# Patient Record
Sex: Female | Born: 1965 | Race: Black or African American | Hispanic: No | Marital: Single | Smoking: Never smoker
Health system: Southern US, Community
[De-identification: ages and names within clinical notes are randomized; demographics above are authoritative.]

## PROBLEM LIST (undated history)

## (undated) DIAGNOSIS — I10 Essential (primary) hypertension: Secondary | ICD-10-CM

## (undated) DIAGNOSIS — D219 Benign neoplasm of connective and other soft tissue, unspecified: Secondary | ICD-10-CM

## (undated) HISTORY — PX: CHOLECYSTECTOMY: SHX55

---

## 2014-09-15 ENCOUNTER — Emergency Department (HOSPITAL_BASED_OUTPATIENT_CLINIC_OR_DEPARTMENT_OTHER)
Admission: EM | Admit: 2014-09-15 | Discharge: 2014-09-15 | Disposition: A | Discharge status: Home / Self Care | Payer: Medicaid Other | Attending: Emergency Medicine | Admitting: Emergency Medicine

## 2014-09-15 ENCOUNTER — Encounter (HOSPITAL_BASED_OUTPATIENT_CLINIC_OR_DEPARTMENT_OTHER): Payer: Self-pay | Admitting: *Deleted

## 2014-09-15 DIAGNOSIS — Z79899 Other long term (current) drug therapy: Secondary | ICD-10-CM | POA: Insufficient documentation

## 2014-09-15 DIAGNOSIS — R002 Palpitations: Secondary | ICD-10-CM | POA: Insufficient documentation

## 2014-09-15 DIAGNOSIS — R0981 Nasal congestion: Secondary | ICD-10-CM | POA: Diagnosis present

## 2014-09-15 DIAGNOSIS — J3489 Other specified disorders of nose and nasal sinuses: Secondary | ICD-10-CM | POA: Diagnosis not present

## 2014-09-15 DIAGNOSIS — I10 Essential (primary) hypertension: Secondary | ICD-10-CM | POA: Insufficient documentation

## 2014-09-15 HISTORY — DX: Essential (primary) hypertension: I10

## 2014-09-15 MED ORDER — FLUTICASONE PROPIONATE 50 MCG/ACT NA SUSP: 2.0000 | Freq: Every day | NASAL | Status: AC

## 2014-09-15 MED ORDER — OXYMETAZOLINE HCL 0.05 % NA SOLN: 1.0000 | Freq: Two times a day (BID) | NASAL | Status: AC

## 2014-09-15 NOTE — ED Notes (Signed)
Cough, congestion, sinus pain, sneezing- states she feels SOB when she lies down to try to sleep

## 2014-09-15 NOTE — ED Provider Notes (Signed)
CSN: [id number]     Arrival date & time 09/15/14  1750 History  This chart was scribed for Wandra Arthurs, MD by Jeanell Sparrow, ED Scribe. This patient was seen in room MH09/MH09 and the patient's care was started at 8:17 PM.  Chief Complaint  Patient presents with  . URI   The history is provided by the patient. No language interpreter was used.    HPI Comments: Rebecca Decker is a 49 y.o. female who presents to the Emergency Department complaining of a URI that started 4 days ago. She states that she has been having congestion, rhinorrhea, a nonproductive cough since she came [place] from [place]. She reports no treatment PTA except drinking hot tea. She states that she gasps for air and has palpitations at night. She reports a hx of HTN, which she is currently on medication for. She denies any fever. No hx of sleep apnea and not smoker.   Past Medical History  Diagnosis Date  . Hypertension    Past Surgical History  Procedure Laterality Date  . Cholecystectomy     No family history on file. History  Substance Use Topics  . Smoking status: Never Smoker   . Smokeless tobacco: Never Used  . Alcohol Use: Yes   OB History    No data available     Review of Systems  Constitutional: Negative for fever.  HENT: Positive for congestion and rhinorrhea.   Respiratory: Positive for cough and shortness of breath.   Cardiovascular: Positive for palpitations.  All other systems reviewed and are negative.   Allergies  Review of patient's allergies indicates no known allergies.  Home Medications   Prior to Admission medications   Medication Sig Start Date End Date Taking? Authorizing Provider  hydrochlorothiazide (HYDRODIURIL) 25 MG tablet Take 25 mg by mouth daily.   Yes Historical Provider, MD  metoprolol (LOPRESSOR) 100 MG tablet Take 100 mg by mouth 2 (two) times daily.   Yes Historical Provider, MD   BP 161/90 mmHg  Pulse 57  Temp(Src) 98.5 F (36.9 C) (Oral)  Resp 18  Ht  5\' 9"  (1.753 m)  Wt 250 lb (113.399 kg)  BMI 36.90 kg/m2  SpO2 100% Physical Exam  Constitutional: She is oriented to person, place, and time. She appears well-developed and well-nourished. No distress.  HENT:  Head: Normocephalic and atraumatic.  Mouth/Throat: Oropharynx is clear and moist. No oropharyngeal exudate.  No sinus tenderness   Neck: Neck supple. No tracheal deviation present.  Cardiovascular: Normal rate, regular rhythm and normal heart sounds.  Exam reveals no gallop and no friction rub.   No murmur heard. Pulmonary/Chest: Effort normal and breath sounds normal. No respiratory distress. She has no wheezes. She has no rales.  No wheezing   Abdominal: Soft. There is no tenderness.  Musculoskeletal: Normal range of motion.  Lymphadenopathy:    She has no cervical adenopathy.  Neurological: She is alert and oriented to person, place, and time.  Skin: Skin is warm and dry.  Psychiatric: She has a normal mood and affect. Her behavior is normal.  Nursing note and vitals reviewed.   ED Course  Procedures (including critical care time) DIAGNOSTIC STUDIES: Oxygen Saturation is 100% on RA, normal by my interpretation.    COORDINATION OF CARE: 8:21 PM- Pt advised of plan for treatment and pt agrees.  Labs Review Labs Reviewed - No data to display  Imaging Review No results found.   EKG Interpretation None  MDM   Final diagnoses:  None   Rebecca Decker is a 49 y.o. female here with sinus congestion, SOB at night. Vitals stable, slightly hypertensive only. Not tachy. I doubt PE. No sinus tenderness or purulent discharge to suggest sinusitis. Symptoms likely from sinus congestion. Will dc home with flonase, afrin.    I personally performed the services described in this documentation, which was scribed in my presence. The recorded information has been reviewed and is accurate.     Wandra Arthurs, MD 09/15/14 2037

## 2014-09-15 NOTE — Discharge Instructions (Signed)
Try flonase twice daily for a week.   Try afrin twice daily for at most 3 days.   Follow up with your doctor.   Return to ER if you have worse congestion, trouble breathing.

## 2015-03-21 ENCOUNTER — Telehealth: Payer: Self-pay | Admitting: Family Medicine

## 2015-03-21 NOTE — Telephone Encounter (Signed)
Pt requesting lab results to be mailed to her home.

## 2015-03-21 NOTE — Telephone Encounter (Signed)
Unable to find any labs on this patient, I tried to call this patient but phone number is not in working order.

## 2015-10-16 ENCOUNTER — Emergency Department (HOSPITAL_COMMUNITY)
Admission: EM | Admit: 2015-10-16 | Discharge: 2015-10-17 | Disposition: A | Discharge status: Home / Self Care | Payer: Medicaid Other | Attending: Emergency Medicine | Admitting: Emergency Medicine

## 2015-10-16 ENCOUNTER — Encounter (HOSPITAL_COMMUNITY): Payer: Self-pay | Admitting: Emergency Medicine

## 2015-10-16 DIAGNOSIS — Z3202 Encounter for pregnancy test, result negative: Secondary | ICD-10-CM | POA: Insufficient documentation

## 2015-10-16 DIAGNOSIS — N390 Urinary tract infection, site not specified: Secondary | ICD-10-CM | POA: Insufficient documentation

## 2015-10-16 DIAGNOSIS — Z86018 Personal history of other benign neoplasm: Secondary | ICD-10-CM | POA: Diagnosis not present

## 2015-10-16 DIAGNOSIS — I10 Essential (primary) hypertension: Secondary | ICD-10-CM | POA: Insufficient documentation

## 2015-10-16 DIAGNOSIS — Z7951 Long term (current) use of inhaled steroids: Secondary | ICD-10-CM | POA: Insufficient documentation

## 2015-10-16 DIAGNOSIS — R300 Dysuria: Secondary | ICD-10-CM | POA: Diagnosis present

## 2015-10-16 DIAGNOSIS — Z79899 Other long term (current) drug therapy: Secondary | ICD-10-CM | POA: Insufficient documentation

## 2015-10-16 DIAGNOSIS — R109 Unspecified abdominal pain: Secondary | ICD-10-CM

## 2015-10-16 HISTORY — DX: Benign neoplasm of connective and other soft tissue, unspecified: D21.9

## 2015-10-16 LAB — URINALYSIS, ROUTINE W REFLEX MICROSCOPIC
Bilirubin Urine: NEGATIVE
Glucose, UA: NEGATIVE mg/dL
Ketones, ur: NEGATIVE mg/dL
Nitrite: NEGATIVE
PH: 7 (ref 5.0–8.0)
PROTEIN: 100 mg/dL — AB
SPECIFIC GRAVITY, URINE: 1.006 (ref 1.005–1.030)

## 2015-10-16 LAB — COMPREHENSIVE METABOLIC PANEL
ALBUMIN: 3.7 g/dL (ref 3.5–5.0)
ALK PHOS: 52 U/L (ref 38–126)
ALT: 18 U/L (ref 14–54)
AST: 22 U/L (ref 15–41)
Anion gap: 9 (ref 5–15)
BUN: 13 mg/dL (ref 6–20)
CO2: 26 mmol/L (ref 22–32)
CREATININE: 1.16 mg/dL — AB (ref 0.44–1.00)
Calcium: 9.1 mg/dL (ref 8.9–10.3)
Chloride: 104 mmol/L (ref 101–111)
GFR calc Af Amer: >60 mL/min (ref >60)
GFR calc non Af Amer: 54 mL/min — ABNORMAL LOW (ref >60)
GLUCOSE: 106 mg/dL — AB (ref 65–99)
Potassium: 3.9 mmol/L (ref 3.5–5.1)
SODIUM: 139 mmol/L (ref 135–145)
Total Bilirubin: 0.4 mg/dL (ref 0.3–1.2)
Total Protein: 7.1 g/dL (ref 6.5–8.1)

## 2015-10-16 LAB — CBC
HCT: 37.6 % (ref 36.0–46.0)
HEMOGLOBIN: 12.4 g/dL (ref 12.0–15.0)
MCH: 29.2 pg (ref 26.0–34.0)
MCHC: 33 g/dL (ref 30.0–36.0)
MCV: 88.5 fL (ref 78.0–100.0)
Platelets: 340 10*3/uL (ref 150–400)
RBC: 4.25 MIL/uL (ref 3.87–5.11)
RDW: 13.6 % (ref 11.5–15.5)
WBC: 10.5 10*3/uL (ref 4.0–10.5)

## 2015-10-16 LAB — URINE MICROSCOPIC-ADD ON

## 2015-10-16 LAB — POC URINE PREG, ED: Preg Test, Ur: NEGATIVE

## 2015-10-16 LAB — LIPASE, BLOOD: Lipase: 18 U/L (ref 11–51)

## 2015-10-16 NOTE — ED Notes (Signed)
EDP ( Dr. Tyron Russell) , Charge nurse Gretta Cool ) and Nurse first Bayard Hugger) notified on pt."s heart rate = 52 .

## 2015-10-16 NOTE — ED Notes (Signed)
Pt. reports dysuria , urinary frequency , low abdominal pain and right low back pain onset today . No fever or chills , denies emesis or diarrhea .

## 2015-10-17 ENCOUNTER — Emergency Department (HOSPITAL_COMMUNITY): Payer: Medicaid Other

## 2015-10-17 MED ORDER — OXYCODONE-ACETAMINOPHEN 5-325 MG PO TABS: 1.0000 | ORAL_TABLET | Freq: Four times a day (QID) | ORAL | Status: AC | PRN

## 2015-10-17 MED ORDER — KETOROLAC TROMETHAMINE 30 MG/ML IJ SOLN
30.0000 mg | Freq: Once | INTRAMUSCULAR | Status: AC
  Administered 2015-10-17: 30 mg via INTRAVENOUS
  Filled 2015-10-17: qty 1

## 2015-10-17 MED ORDER — ONDANSETRON HCL 4 MG/2ML IJ SOLN
4.0000 mg | Freq: Once | INTRAMUSCULAR | Status: AC
  Administered 2015-10-17: 4 mg via INTRAVENOUS
  Filled 2015-10-17: qty 2

## 2015-10-17 MED ORDER — CEPHALEXIN 250 MG PO CAPS
500.0000 mg | ORAL_CAPSULE | Freq: Once | ORAL | Status: AC
  Administered 2015-10-17: 500 mg via ORAL
  Filled 2015-10-17: qty 2

## 2015-10-17 MED ORDER — HYDROMORPHONE HCL 1 MG/ML IJ SOLN
1.0000 mg | Freq: Once | INTRAMUSCULAR | Status: AC
Start: 2015-10-17 — End: 2015-10-17
  Administered 2015-10-17: 1 mg via INTRAVENOUS
  Filled 2015-10-17: qty 1

## 2015-10-17 MED ORDER — CEPHALEXIN 500 MG PO CAPS: 500.0000 mg | ORAL_CAPSULE | Freq: Two times a day (BID) | ORAL | Status: AC

## 2015-10-17 MED ORDER — HYDROMORPHONE HCL 1 MG/ML IJ SOLN
1.0000 mg | Freq: Once | INTRAMUSCULAR | Status: AC
  Administered 2015-10-17: 1 mg via INTRAVENOUS
  Filled 2015-10-17: qty 1

## 2015-10-17 NOTE — ED Provider Notes (Signed)
CSN: [id number]     Arrival date & time 10/16/15  2200 History   First MD Initiated Contact with Patient 10/17/15 0003     Chief Complaint  Patient presents with  . Dysuria  . Abdominal Pain     (Consider location/radiation/quality/duration/timing/severity/associated sxs/prior Treatment) HPI Patient presents to the emergency department with flank pain, urinary frequency and lower abdominal pain that started earlier this afternoon.  The patient states that she did notice some difficulty with urination several days ago.  She states that she had also had frequency of urination.The patient denies chest pain, shortness of breath, headache,blurred vision, neck pain, fever, cough, weakness, numbness, dizziness, anorexia, edema, abdominal pain, nausea, vomiting, diarrhea, rash, hematemesis, bloody stool, near syncope, or syncope. Past Medical History  Diagnosis Date  . Hypertension   . Fibroids    Past Surgical History  Procedure Laterality Date  . Cholecystectomy     No family history on file. Social History  Substance Use Topics  . Smoking status: Never Smoker   . Smokeless tobacco: Never Used  . Alcohol Use: Yes   OB History    No data available     Review of Systems  All other systems negative except as documented in the HPI. All pertinent positives and negatives as reviewed in the HPI.  Allergies  Review of patient's allergies indicates no known allergies.  Home Medications   Prior to Admission medications   Medication Sig Start Date End Date Taking? Authorizing Provider  fluticasone (FLONASE) 50 MCG/ACT nasal spray Place 2 sprays into both nostrils daily. 09/15/14   Wandra Arthurs, MD  hydrochlorothiazide (HYDRODIURIL) 25 MG tablet Take 25 mg by mouth daily.    Historical Provider, MD  metoprolol (LOPRESSOR) 100 MG tablet Take 100 mg by mouth 2 (two) times daily.    Historical Provider, MD  oxymetazoline (AFRIN NASAL SPRAY) 0.05 % nasal spray Place 1 spray into both nostrils  2 (two) times daily. 09/15/14   Wandra Arthurs, MD   BP 160/83 mmHg  Pulse 52  Temp(Src) 98.5 F (36.9 C) (Oral)  Resp 24  Ht 5\' 9"  (1.753 m)  Wt 115.667 kg  BMI 37.64 kg/m2  SpO2 99%  LMP 10/02/2015 (Approximate) Physical Exam  Constitutional: She is oriented to person, place, and time. She appears well-developed and well-nourished. No distress.  HENT:  Head: Normocephalic and atraumatic.  Mouth/Throat: Oropharynx is clear and moist.  Eyes: Pupils are equal, round, and reactive to light.  Neck: Normal range of motion. Neck supple.  Cardiovascular: Normal rate, regular rhythm and normal heart sounds.  Exam reveals no gallop and no friction rub.   No murmur heard. Pulmonary/Chest: Effort normal and breath sounds normal. No respiratory distress. She has no wheezes.  Abdominal: Soft. Bowel sounds are normal. She exhibits no distension. There is tenderness. There is no rebound and no guarding.  Neurological: She is alert and oriented to person, place, and time. She exhibits normal muscle tone. Coordination normal.  Skin: Skin is warm and dry. No rash noted. No erythema.  Psychiatric: She has a normal mood and affect. Her behavior is normal.  Nursing note and vitals reviewed.   ED Course  Procedures (including critical care time) Labs Review Labs Reviewed  COMPREHENSIVE METABOLIC PANEL - Abnormal; Notable for the following:    Glucose, Bld 106 (*)    Creatinine, Ser 1.16 (*)    GFR calc non Af Amer 54 (*)    All other components within normal limits  URINALYSIS,  ROUTINE W REFLEX MICROSCOPIC (NOT AT [facility]) - Abnormal; Notable for the following:    APPearance CLOUDY (*)    Hgb urine dipstick LARGE (*)    Protein, ur 100 (*)    Leukocytes, UA LARGE (*)    All other components within normal limits  URINE MICROSCOPIC-ADD ON - Abnormal; Notable for the following:    Squamous Epithelial / LPF 0-5 (*)    Bacteria, UA RARE (*)    All other components within normal limits  LIPASE, BLOOD   CBC  POC URINE PREG, ED    Imaging Review No results found. I have personally reviewed and evaluated these images and lab results as part of my medical decision-making.   EKG Interpretation   Date/Time:  Wednesday October 16 2015 22:41:40 EDT Ventricular Rate:  53 PR Interval:  188 QRS Duration: 88 QT Interval:  470 QTC Calculation: 441 R Axis:   -16 Text Interpretation:  Sinus bradycardia Low voltage QRS Borderline ECG  Confirmed by Hazle Coca [phone]) on 10/17/2015 12:25:12 AM      MDM   Final diagnoses:  Flank pain     Patient will be evaluated with CT scan for possible ureteral stone.  Patient is advised plan.  All questions were answered.  She is given pain control and is feeling some better at this time  Dalia Heading, PA-C 10/17/15 [place], MD 10/18/15 [phone]

## 2015-10-17 NOTE — Discharge Instructions (Signed)
Return here as needed. Follow up with the doctor provided.  Urinary Tract Infection Urinary tract infections (UTIs) can develop anywhere along your urinary tract. Your urinary tract is your body's drainage system for removing wastes and extra water. Your urinary tract includes two kidneys, two ureters, a bladder, and a urethra. Your kidneys are a pair of bean-shaped organs. Each kidney is about the size of your fist. They are located below your ribs, one on each side of your spine. CAUSES Infections are caused by microbes, which are microscopic organisms, including fungi, viruses, and bacteria. These organisms are so small that they can only be seen through a microscope. Bacteria are the microbes that most commonly cause UTIs. SYMPTOMS  Symptoms of UTIs may vary by age and gender of the patient and by the location of the infection. Symptoms in young women typically include a frequent and intense urge to urinate and a painful, burning feeling in the bladder or urethra during urination. Older women and men are more likely to be tired, shaky, and weak and have muscle aches and abdominal pain. A fever may mean the infection is in your kidneys. Other symptoms of a kidney infection include pain in your back or sides below the ribs, nausea, and vomiting. DIAGNOSIS To diagnose a UTI, your caregiver will ask you about your symptoms. Your caregiver will also ask you to provide a urine sample. The urine sample will be tested for bacteria and white blood cells. White blood cells are made by your body to help fight infection. TREATMENT  Typically, UTIs can be treated with medication. Because most UTIs are caused by a bacterial infection, they usually can be treated with the use of antibiotics. The choice of antibiotic and length of treatment depend on your symptoms and the type of bacteria causing your infection. HOME CARE INSTRUCTIONS  If you were prescribed antibiotics, take them exactly as your caregiver  instructs you. Finish the medication even if you feel better after you have only taken some of the medication.  Drink enough water and fluids to keep your urine clear or pale yellow.  Avoid caffeine, tea, and carbonated beverages. They tend to irritate your bladder.  Empty your bladder often. Avoid holding urine for long periods of time.  Empty your bladder before and after sexual intercourse.  After a bowel movement, women should cleanse from front to back. Use each tissue only once. SEEK MEDICAL CARE IF:   You have back pain.  You develop a fever.  Your symptoms do not begin to resolve within 3 days. SEEK IMMEDIATE MEDICAL CARE IF:   You have severe back pain or lower abdominal pain.  You develop chills.  You have nausea or vomiting.  You have continued burning or discomfort with urination. MAKE SURE YOU:   Understand these instructions.  Will watch your condition.  Will get help right away if you are not doing well or get worse.   This information is not intended to replace advice given to you by your health care provider. Make sure you discuss any questions you have with your health care provider.   Document Released: 04/15/2005 Document Revised: 03/27/2015 Document Reviewed: 08/14/2011 Elsevier Interactive Patient Education 2016 Elsevier Inc.  Abdominal Pain, Adult Many things can cause abdominal pain. Usually, abdominal pain is not caused by a disease and will improve without treatment. It can often be observed and treated at home. Your health care provider will do a physical exam and possibly order blood tests and X-rays to  help determine the seriousness of your pain. However, in many cases, more time must pass before a clear cause of the pain can be found. Before that point, your health care provider may not know if you need more testing or further treatment. HOME CARE INSTRUCTIONS Monitor your abdominal pain for any changes. The following actions may help to  alleviate any discomfort you are experiencing:  Only take over-the-counter or prescription medicines as directed by your health care provider.  Do not take laxatives unless directed to do so by your health care provider.  Try a clear liquid diet (broth, tea, or water) as directed by your health care provider. Slowly move to a bland diet as tolerated. SEEK MEDICAL CARE IF:  You have unexplained abdominal pain.  You have abdominal pain associated with nausea or diarrhea.  You have pain when you urinate or have a bowel movement.  You experience abdominal pain that wakes you in the night.  You have abdominal pain that is worsened or improved by eating food.  You have abdominal pain that is worsened with eating fatty foods.  You have a fever. SEEK IMMEDIATE MEDICAL CARE IF:  Your pain does not go away within 2 hours.  You keep throwing up (vomiting).  Your pain is felt only in portions of the abdomen, such as the right side or the left lower portion of the abdomen.  You pass bloody or black tarry stools. MAKE SURE YOU:  Understand these instructions.  Will watch your condition.  Will get help right away if you are not doing well or get worse.   This information is not intended to replace advice given to you by your health care provider. Make sure you discuss any questions you have with your health care provider.   Document Released: 04/15/2005 Document Revised: 03/27/2015 Document Reviewed: 03/15/2013 Elsevier Interactive Patient Education [employer].

## 2015-10-19 LAB — URINE CULTURE: Culture: >=100000

## 2015-10-20 ENCOUNTER — Telehealth (HOSPITAL_BASED_OUTPATIENT_CLINIC_OR_DEPARTMENT_OTHER): Payer: Self-pay | Admitting: Emergency Medicine

## 2015-10-20 NOTE — Telephone Encounter (Signed)
Post ED Visit - Positive Culture Follow-up  Culture report reviewed by antimicrobial stewardship pharmacist:  []  Elenor Quinones, Pharm.D. []  Heide Guile, Pharm.D., BCPS []  Parks Neptune, Pharm.D. []  Alycia Rossetti, Pharm.D., BCPS []  Edmund, [place].D., BCPS, AAHIVP []  Legrand Como, Pharm.D., BCPS, AAHIVP []  Milus Glazier, Pharm.D. []  Stephens November, Pharm.D. Salome Arnt PharmD  Positive urine  culture Treated with cephalexin, organism sensitive to the same and no further patient follow-up is required at this time.  Hazle Nordmann 10/20/2015, 12:22 PM

## 2017-05-05 IMAGING — CT CT RENAL STONE PROTOCOL
2 of 4 series · 16 of 46 positions shown, 18 images · non-contrast
Comparison: None.

CLINICAL DATA: 50-year-old female with history of and urinary
frequency.

EXAM:
CT ABDOMEN AND PELVIS WITHOUT CONTRAST
TECHNIQUE: Multidetector CT imaging of the abdomen and pelvis was performed
following the standard protocol without IV contrast.

[Series 2: stone study 5.0 i30f 1 · axial · 0.76mm/px · z∈[-492,-62]mm · 13 of 94 slices shown, 15 images]
[im 4/94  soft-tissue]
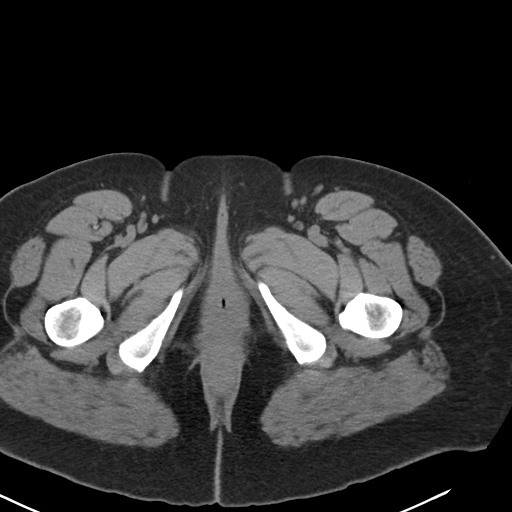
[im 4/94  bone]
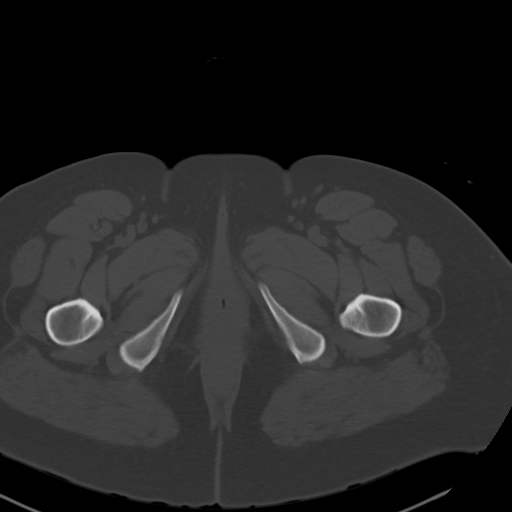
[im 12/94  soft-tissue]
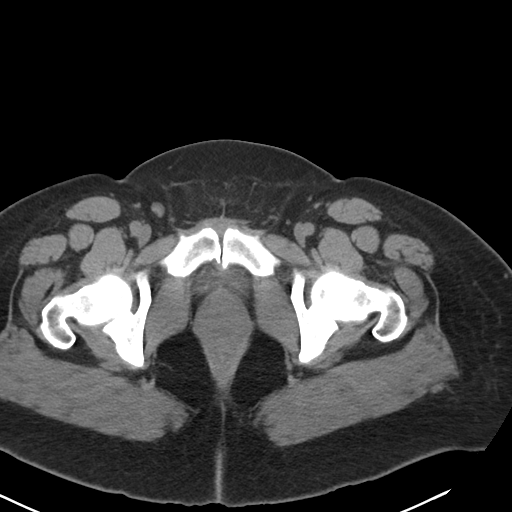
[im 20/94  soft-tissue]
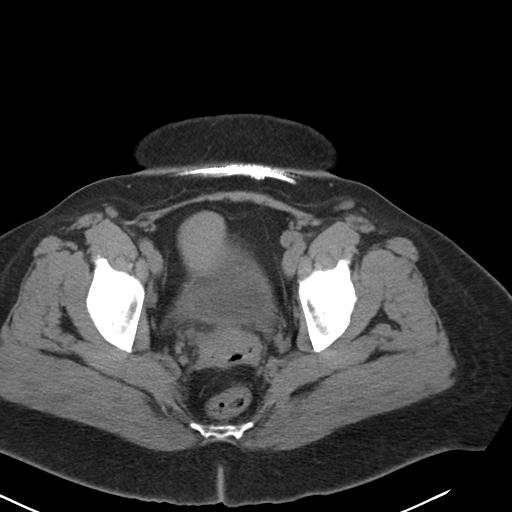
[im 28/94  soft-tissue]
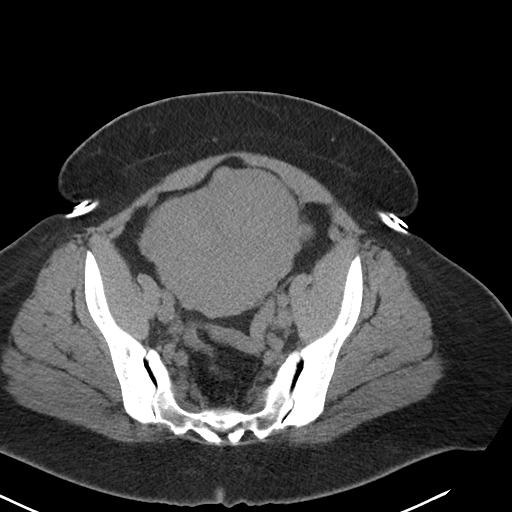
[im 32/94  soft-tissue]
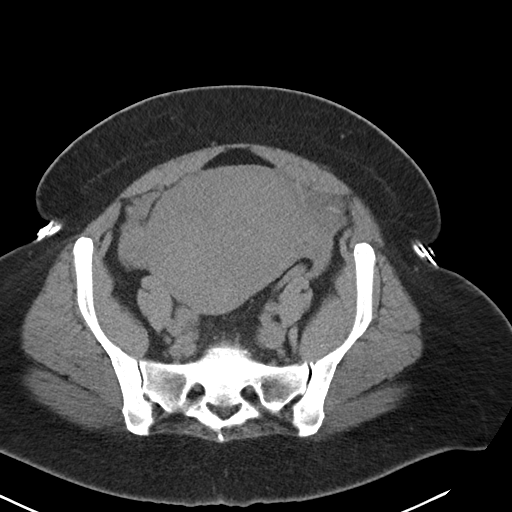
[im 39/94  soft-tissue]
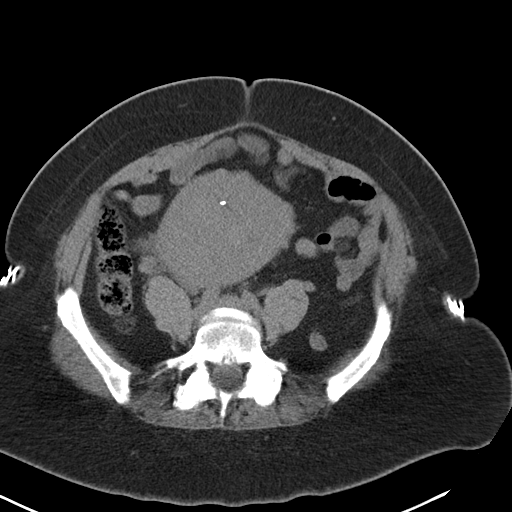
[im 47/94  soft-tissue]
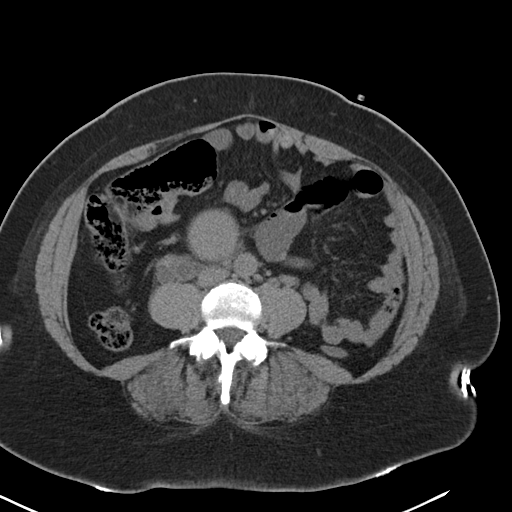
[im 55/94  soft-tissue]
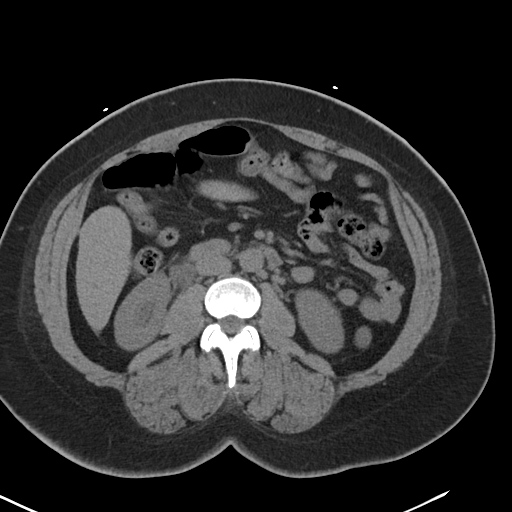
[im 63/94  soft-tissue]
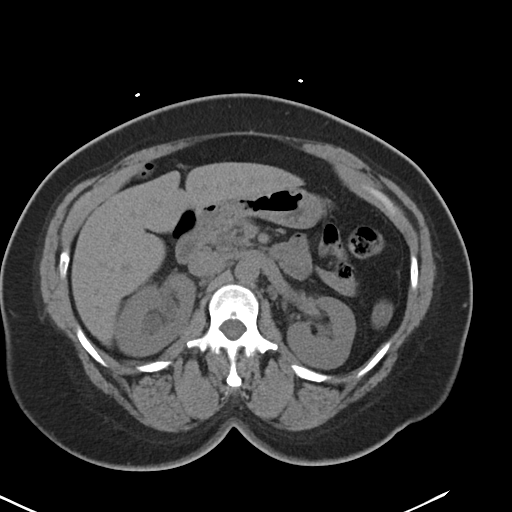
[im 63/94  bone]
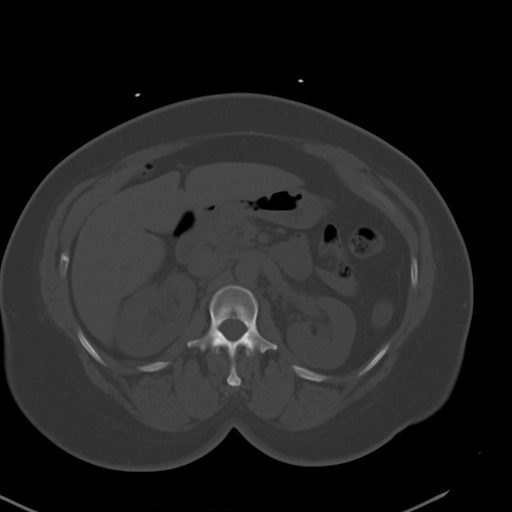
[im 66/94  soft-tissue]
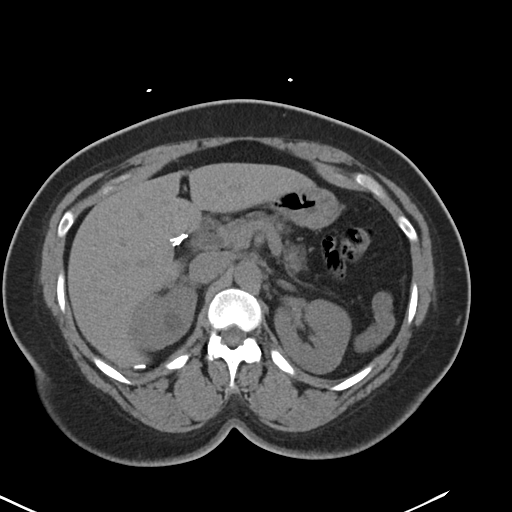
[im 74/94  soft-tissue]
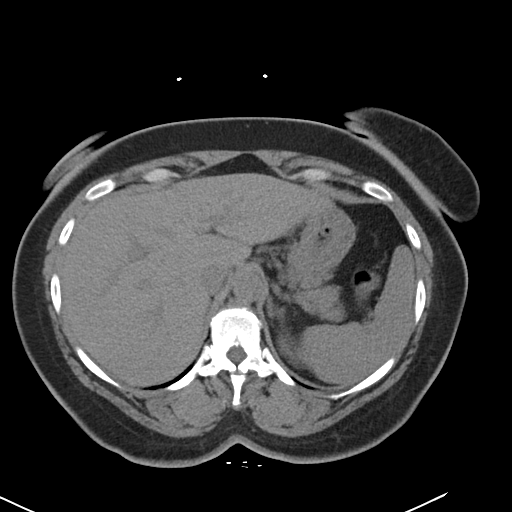
[im 82/94  soft-tissue]
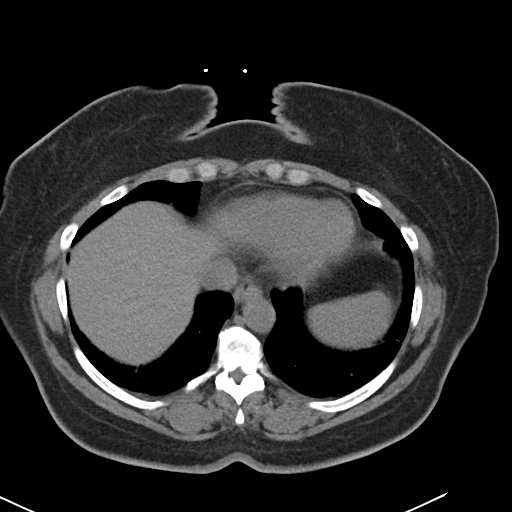
[im 90/94  soft-tissue]
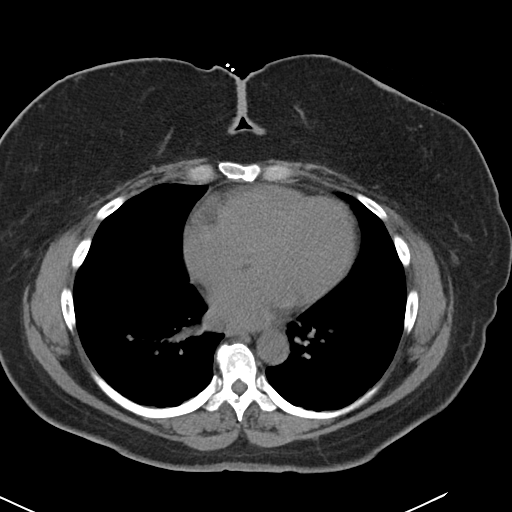

[Series 5: coronal soft tissue · coronal · 0.77mm/px · 3 of 89 slices shown]
[im 30/89  soft-tissue]
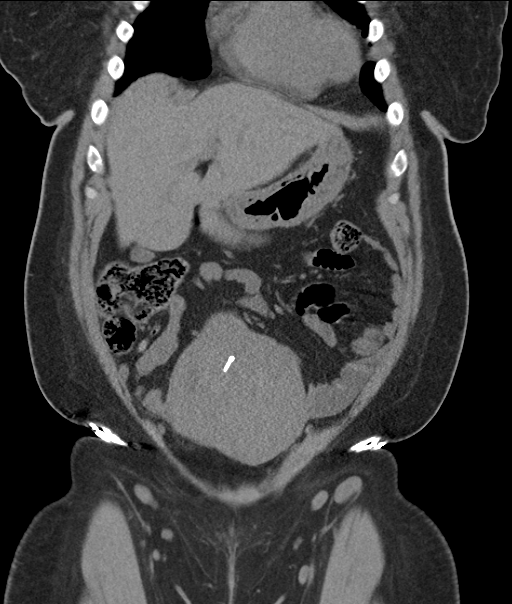
[im 40/89  soft-tissue]
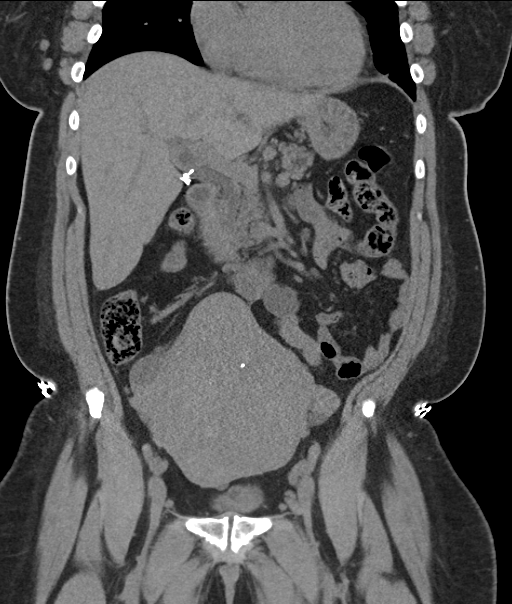
[im 49/89  soft-tissue]
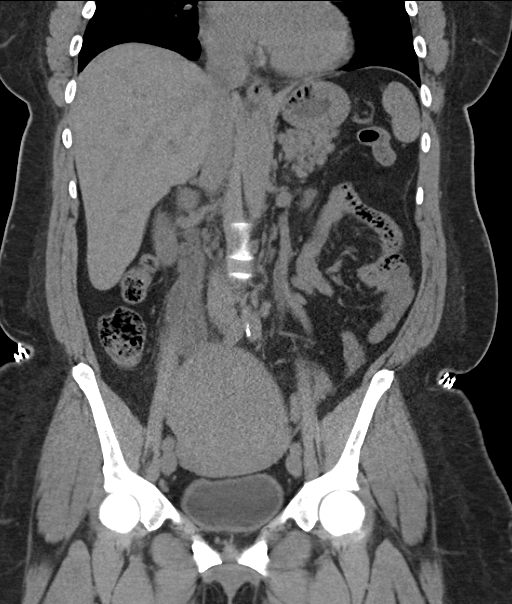

[16 of 46 positions shown; findings below may reference images not displayed]

FINDINGS: Evaluation of this exam is limited in the absence of intravenous
contrast.

The visualized lung bases are clear. No intra-abdominal free air or
free fluid.

Cholecystectomy. The liver, pancreas, spleen, adrenal glands, left
kidney, left ureter appear unremarkable. There is a duplicated right
renal collecting system as well as duplication of the proximal and
mid portion of the right ureter. There is mild hydronephrosis of the
right kidney as well as mild right hydroureter. There is haziness of
the urothelium with periureteric stranding. No definite stone
identified. Findings may be related to a recently passed calculus
versus right-sided pyelonephritis. Correlation with urinalysis
recommended. There is mild diffuse thickening of the bladder wall as
well.

The uterus is anteverted, enlarged, and myomatous. An intrauterine
device is noted which appears displaced by the uterine fibroids.
Nonemergent ultrasound or MRI is recommended for further evaluation
of the uterus.

There is no evidence of bowel obstruction or inflammation. Normal
appendix.

The abdominal aorta and IVC appear unremarkable. No portal venous
gas identified. There is no adenopathy. The abdominal wall soft
tissues appear unremarkable. There are degenerative changes of the
spine. Bilateral sacroiliac sclerotic changes related to chronic
sacroiliitis. No acute fracture.
IMPRESSION: Duplicated appearance of the right renal collecting system and
proximal and mid ureters with mild right hydronephrosis and findings
concerning for urinary tract infection. No renal calculus identified
on this study. Correlation with urinalysis recommended.

Enlarged myomatous uterus.

## 2022-04-22 ENCOUNTER — Emergency Department (HOSPITAL_BASED_OUTPATIENT_CLINIC_OR_DEPARTMENT_OTHER)
Admission: EM | Admit: 2022-04-22 | Discharge: 2022-04-22 | Disposition: A | Discharge status: Home / Self Care | Attending: Emergency Medicine | Admitting: Emergency Medicine

## 2022-04-22 ENCOUNTER — Encounter (HOSPITAL_BASED_OUTPATIENT_CLINIC_OR_DEPARTMENT_OTHER): Payer: Self-pay | Admitting: Emergency Medicine

## 2022-04-22 ENCOUNTER — Other Ambulatory Visit: Payer: Self-pay

## 2022-04-22 ENCOUNTER — Emergency Department (HOSPITAL_BASED_OUTPATIENT_CLINIC_OR_DEPARTMENT_OTHER)

## 2022-04-22 DIAGNOSIS — M79642 Pain in left hand: Secondary | ICD-10-CM | POA: Diagnosis present

## 2022-04-22 MED ORDER — METHYLPREDNISOLONE 4 MG PO TBPK: ORAL_TABLET | ORAL | 0 refills | Status: AC

## 2022-04-22 NOTE — ED Triage Notes (Signed)
Report left hand pain post injury 3 weeks ago at work lifting heavy object . No obvious deformity or swelling .

## 2022-04-22 NOTE — ED Provider Notes (Signed)
[facility] Provider Note   CSN: [id number] Arrival date & time: 04/22/22  6834     History  Chief Complaint  Patient presents with   Hand Pain    Rebecca Decker is a 56 y.o. female.  Patient here with left hand pain on and off for the last several weeks.  Uses her hands at work.  Pain after working with some heavy objects.  Nothing makes it worse or better.  Over-the-counter medications have not helped much.  History of hypertension.  Denies any numbness, weakness.  No headache or neck pain.  The history is provided by the patient.       Home Medications Prior to Admission medications   Medication Sig Start Date End Date Taking? Authorizing Provider  methylPREDNISolone (MEDROL DOSEPAK) 4 MG TBPK tablet Follow package insert 04/22/22  Yes Arlone Lenhardt, DO  acetaminophen (TYLENOL) 500 MG tablet Take 500-1,000 mg by mouth every 6 (six) hours as needed for mild pain or headache.    [provider]  cephALEXin (KEFLEX) 500 MG capsule Take 1 capsule (500 mg total) by mouth 2 (two) times daily. 10/17/15   Quintella Reichert, MD  fluticasone [facility]) 50 MCG/ACT nasal spray Place 2 sprays into both nostrils daily. Patient taking differently: Place 2 sprays into both nostrils daily as needed for allergies.  09/15/14   Drenda Freeze, MD  hydrochlorothiazide (HYDRODIURIL) 25 MG tablet Take 25 mg by mouth daily.    [provider]  lisinopril (PRINIVIL,ZESTRIL) 5 MG tablet Take 5 mg by mouth daily.    [provider]  metoprolol (LOPRESSOR) 100 MG tablet Take 50 mg by mouth 2 (two) times daily.     [provider]  oxyCODONE-acetaminophen (PERCOCET/ROXICET) 5-325 MG tablet Take 1 tablet by mouth every 6 (six) hours as needed for severe pain. 10/17/15   Lawyer, Harrell Gave, PA-C  oxymetazoline (AFRIN NASAL SPRAY) 0.05 % nasal spray Place 1 spray into both nostrils 2 (two) times daily. Patient taking differently: Place 1 spray  into both nostrils 2 (two) times daily as needed for congestion.  09/15/14   Drenda Freeze, MD      Allergies    Patient has no known allergies.    Review of Systems   Review of Systems  Physical Exam Updated Vital Signs  ED Triage Vitals  Enc Vitals Group     BP 04/22/22 0730 114/88     Pulse Rate 04/22/22 0730 63     Resp 04/22/22 0730 14     Temp 04/22/22 0730 97.9 F (36.6 C)     Temp src --      SpO2 04/22/22 0730 93 %     Weight 04/22/22 0732 204 lb (92.5 kg)     Height 04/22/22 0732 '5\' 9"'$  (1.753 m)     Head Circumference --      Peak Flow --      Pain Score 04/22/22 0732 5     Pain Loc --      Pain Edu? --      Excl. in [place]? --     Physical Exam Vitals and nursing note reviewed.  Constitutional:      General: She is not in acute distress.    Appearance: She is well-developed. She is not ill-appearing.  HENT:     Head: Normocephalic and atraumatic.  Cardiovascular:     Rate and Rhythm: Normal rate and regular rhythm.     Pulses: Normal pulses.  Heart sounds: No murmur heard. Abdominal:     Palpations: Abdomen is soft.  Musculoskeletal:        General: Tenderness present. No swelling or deformity. Normal range of motion.     Cervical back: Normal range of motion and neck supple.  Skin:    General: Skin is warm and dry.     Capillary Refill: Capillary refill takes less than 2 seconds.  Neurological:     Mental Status: She is alert.     Sensory: No sensory deficit.     Motor: No weakness.  Psychiatric:        Mood and Affect: Mood normal.     ED Results / Procedures / Treatments   Labs (all labs ordered are listed, but only abnormal results are displayed) Labs Reviewed - No data to display  EKG None  Radiology DG Hand Complete Left  Result Date: 04/22/2022 CLINICAL DATA:  Pain in around the thumb related to work. EXAM: LEFT HAND - COMPLETE 3+ VIEW COMPARISON:  None Available. FINDINGS: There is no evidence of fracture or dislocation.  There is no evidence of arthropathy or other focal bone abnormality. Soft tissues are unremarkable. IMPRESSION: Negative. Electronically Signed   By: Jorje Guild M.D.   On: 04/22/2022 07:25    Procedures Procedures    Medications Ordered in ED Medications - No data to display  ED Course/ Medical Decision Making/ A&P                           Medical Decision Making Amount and/or Complexity of Data Reviewed Radiology: ordered.  Risk Prescription drug management.   [name] is here with left hand pain for the last several weeks.  Differential diagnosis likely overuse injury/arthritis versus less likely fracture/contusion.  Neurovascular neuromuscular intact.  X-ray per my review and interpretation shows no acute fracture or malalignment.  Overall we will start Medrol Dosepak to help with any inflammatory process.  We will have her follow-up with hand team.  Discharged in good condition.  This chart was dictated using voice recognition software.  Despite best efforts to proofread,  errors can occur which can change the documentation meaning.         Final Clinical Impression(s) / ED Diagnoses Final diagnoses:  Hand pain, left    Rx / DC Orders ED Discharge Orders          Ordered    methylPREDNISolone (MEDROL DOSEPAK) 4 MG TBPK tablet        04/22/22 Farr West, [place], DO 04/22/22 [phone]
